# Patient Record
Sex: Male | Born: 1985 | Race: White | Hispanic: No | Marital: Single | Smoking: Former smoker
Health system: Southern US, Community
[De-identification: ages and names within clinical notes are randomized; demographics above are authoritative.]

## PROBLEM LIST (undated history)

## (undated) HISTORY — PX: APPENDECTOMY: SHX54

---

## 2009-07-23 ENCOUNTER — Inpatient Hospital Stay (HOSPITAL_COMMUNITY): Admission: EM | Admit: 2009-07-23 | Discharge: 2009-07-24 | Payer: Self-pay | Admitting: Emergency Medicine

## 2009-07-23 ENCOUNTER — Encounter (INDEPENDENT_AMBULATORY_CARE_PROVIDER_SITE_OTHER): Payer: Self-pay | Admitting: General Surgery

## 2010-09-03 LAB — COMPREHENSIVE METABOLIC PANEL
ALT: 28 U/L (ref 0–53)
AST: 24 U/L (ref 0–37)
Albumin: 4.4 g/dL (ref 3.5–5.2)
BUN: 7 mg/dL (ref 6–23)
CO2: 28 mEq/L (ref 19–32)
Calcium: 9.2 mg/dL (ref 8.4–10.5)
Creatinine, Ser: 0.7 mg/dL (ref 0.4–1.5)
Glucose, Bld: 120 mg/dL — ABNORMAL HIGH (ref 70–99)
Sodium: 137 mEq/L (ref 135–145)
Total Protein: 7.7 g/dL (ref 6.0–8.3)

## 2010-09-03 LAB — CBC
HCT: 35.3 % — ABNORMAL LOW (ref 39.0–52.0)
Hemoglobin: 12.3 g/dL — ABNORMAL LOW (ref 13.0–17.0)
Hemoglobin: 14.7 g/dL (ref 13.0–17.0)
MCV: 90.1 fL (ref 78.0–100.0)
Platelets: 230 10*3/uL (ref 150–400)
RBC: 4.75 MIL/uL (ref 4.22–5.81)
RDW: 12.9 % (ref 11.5–15.5)
WBC: 16.2 10*3/uL — ABNORMAL HIGH (ref 4.0–10.5)
WBC: 18.9 10*3/uL — ABNORMAL HIGH (ref 4.0–10.5)

## 2010-09-03 LAB — URINALYSIS, ROUTINE W REFLEX MICROSCOPIC
Bilirubin Urine: NEGATIVE
Nitrite: NEGATIVE
Protein, ur: NEGATIVE mg/dL
Specific Gravity, Urine: 1.02 (ref 1.005–1.030)
pH: 8 (ref 5.0–8.0)

## 2010-09-03 LAB — DIFFERENTIAL
Basophils Absolute: 0 10*3/uL (ref 0.0–0.1)
Eosinophils Relative: 0 % (ref 0–5)
Monocytes Relative: 7 % (ref 3–12)
Neutrophils Relative %: 85 % — ABNORMAL HIGH (ref 43–77)

## 2010-09-03 LAB — PROTIME-INR
INR: 0.95 (ref 0.00–1.49)
Prothrombin Time: 12.6 seconds (ref 11.6–15.2)

## 2010-09-03 LAB — BASIC METABOLIC PANEL
Chloride: 102 mEq/L (ref 96–112)
GFR calc Af Amer: >60 mL/min (ref >60)
GFR calc non Af Amer: >60 mL/min (ref >60)
Glucose, Bld: 145 mg/dL — ABNORMAL HIGH (ref 70–99)

## 2010-09-03 LAB — APTT: aPTT: 31 seconds (ref 24–37)

## 2010-09-03 LAB — URINE CULTURE

## 2010-09-03 LAB — LIPASE, BLOOD: Lipase: 27 U/L (ref 11–59)

## 2013-01-27 ENCOUNTER — Encounter (HOSPITAL_COMMUNITY): Payer: Self-pay | Admitting: Emergency Medicine

## 2013-01-27 ENCOUNTER — Emergency Department (HOSPITAL_COMMUNITY): Payer: No Typology Code available for payment source

## 2013-01-27 ENCOUNTER — Emergency Department (HOSPITAL_COMMUNITY)
Admission: EM | Admit: 2013-01-27 | Discharge: 2013-01-28 | Disposition: A | Discharge status: Home / Self Care | Payer: No Typology Code available for payment source | Attending: Emergency Medicine | Admitting: Emergency Medicine

## 2013-01-27 DIAGNOSIS — R040 Epistaxis: Secondary | ICD-10-CM | POA: Insufficient documentation

## 2013-01-27 DIAGNOSIS — Y9389 Activity, other specified: Secondary | ICD-10-CM | POA: Insufficient documentation

## 2013-01-27 DIAGNOSIS — Y92410 Unspecified street and highway as the place of occurrence of the external cause: Secondary | ICD-10-CM | POA: Insufficient documentation

## 2013-01-27 DIAGNOSIS — F1010 Alcohol abuse, uncomplicated: Secondary | ICD-10-CM | POA: Insufficient documentation

## 2013-01-27 DIAGNOSIS — S022XXA Fracture of nasal bones, initial encounter for closed fracture: Secondary | ICD-10-CM

## 2013-01-27 DIAGNOSIS — F17200 Nicotine dependence, unspecified, uncomplicated: Secondary | ICD-10-CM | POA: Insufficient documentation

## 2013-01-27 DIAGNOSIS — F10929 Alcohol use, unspecified with intoxication, unspecified: Secondary | ICD-10-CM

## 2013-01-27 LAB — BASIC METABOLIC PANEL
BUN: 9 mg/dL (ref 6–23)
CO2: 20 mEq/L (ref 19–32)
Calcium: 8.7 mg/dL (ref 8.4–10.5)
Chloride: 106 mEq/L (ref 96–112)
Creatinine, Ser: 0.71 mg/dL (ref 0.50–1.35)
GFR calc Af Amer: >90 mL/min (ref >90)
GFR calc non Af Amer: >90 mL/min (ref >90)
Glucose, Bld: 113 mg/dL — ABNORMAL HIGH (ref 70–99)
Potassium: 3.3 mEq/L — ABNORMAL LOW (ref 3.5–5.1)
Sodium: 141 mEq/L (ref 135–145)

## 2013-01-27 LAB — CBC
HCT: 41.9 % (ref 39.0–52.0)
Hemoglobin: 14.6 g/dL (ref 13.0–17.0)
MCH: 30 pg (ref 26.0–34.0)
MCHC: 34.8 g/dL (ref 30.0–36.0)
MCV: 86 fL (ref 78.0–100.0)
Platelets: 286 10*3/uL (ref 150–400)
RBC: 4.87 MIL/uL (ref 4.22–5.81)
RDW: 12.8 % (ref 11.5–15.5)
WBC: 10.5 10*3/uL (ref 4.0–10.5)

## 2013-01-27 LAB — ETHANOL: Alcohol, Ethyl (B): 280 mg/dL — ABNORMAL HIGH (ref 0–11)

## 2013-01-27 MED ORDER — SODIUM CHLORIDE 0.9 % IV BOLUS (SEPSIS)
1000.0000 mL | INTRAVENOUS | Status: AC
  Administered 2013-01-27: 1000 mL via INTRAVENOUS

## 2013-01-27 NOTE — ED Notes (Signed)
Patient transported to CT 
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
## 2013-01-28 ENCOUNTER — Emergency Department (HOSPITAL_COMMUNITY): Payer: No Typology Code available for payment source

## 2013-01-28 ENCOUNTER — Encounter (HOSPITAL_COMMUNITY): Payer: Self-pay | Admitting: Radiology

## 2013-01-28 LAB — RAPID URINE DRUG SCREEN, HOSP PERFORMED
Amphetamines: NOT DETECTED
Barbiturates: NOT DETECTED
Benzodiazepines: NOT DETECTED
Cocaine: NOT DETECTED
Opiates: NOT DETECTED
Tetrahydrocannabinol: NOT DETECTED

## 2013-01-28 MED ORDER — IOHEXOL 300 MG/ML  SOLN
100.0000 mL | Freq: Once | INTRAMUSCULAR | Status: AC | PRN
  Administered 2013-01-28: 100 mL via INTRAVENOUS
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
## 2022-08-23 ENCOUNTER — Encounter (HOSPITAL_COMMUNITY): Payer: Self-pay | Admitting: Emergency Medicine

## 2022-08-23 ENCOUNTER — Other Ambulatory Visit: Payer: Self-pay

## 2022-08-23 ENCOUNTER — Emergency Department (HOSPITAL_COMMUNITY): Payer: Self-pay

## 2022-08-23 ENCOUNTER — Emergency Department (HOSPITAL_COMMUNITY)
Admission: EM | Admit: 2022-08-23 | Discharge: 2022-08-23 | Disposition: A | Discharge status: Home / Self Care | Payer: Self-pay | Attending: Emergency Medicine | Admitting: Emergency Medicine

## 2022-08-23 DIAGNOSIS — S39012A Strain of muscle, fascia and tendon of lower back, initial encounter: Secondary | ICD-10-CM

## 2022-08-23 DIAGNOSIS — M545 Low back pain, unspecified: Secondary | ICD-10-CM | POA: Diagnosis not present

## 2022-08-23 DIAGNOSIS — Y92410 Unspecified street and highway as the place of occurrence of the external cause: Secondary | ICD-10-CM | POA: Insufficient documentation

## 2022-08-23 MED ORDER — METHOCARBAMOL 500 MG PO TABS: 500.0000 mg | ORAL_TABLET | Freq: Two times a day (BID) | ORAL | 0 refills | Status: AC

## 2022-08-23 MED ORDER — IBUPROFEN 600 MG PO TABS: 600.0000 mg | ORAL_TABLET | Freq: Four times a day (QID) | ORAL | 0 refills | Status: AC | PRN
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
